# Patient Record
Sex: Male | Born: 2000 | Hispanic: Yes | Marital: Single | Smoking: Never smoker
Health system: Southern US, Community
[De-identification: ages and names within clinical notes are randomized; demographics above are authoritative.]

## PROBLEM LIST (undated history)

## (undated) DIAGNOSIS — J45909 Unspecified asthma, uncomplicated: Secondary | ICD-10-CM

---
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
## 2020-02-20 ENCOUNTER — Telehealth: Payer: Self-pay

## 2020-02-20 ENCOUNTER — Other Ambulatory Visit: Payer: Self-pay

## 2020-02-20 ENCOUNTER — Telehealth: Payer: Self-pay | Admitting: Emergency Medicine

## 2020-02-20 ENCOUNTER — Ambulatory Visit
Admission: EM | Admit: 2020-02-20 | Discharge: 2020-02-20 | Disposition: A | Discharge status: Home / Self Care | Payer: Self-pay | Attending: Emergency Medicine | Admitting: Emergency Medicine

## 2020-02-20 DIAGNOSIS — J4520 Mild intermittent asthma, uncomplicated: Secondary | ICD-10-CM

## 2020-02-20 DIAGNOSIS — Z760 Encounter for issue of repeat prescription: Secondary | ICD-10-CM

## 2020-02-20 MED ORDER — FLUTICASONE PROPIONATE 50 MCG/ACT NA SUSP: 2.0000 | Freq: Every day | NASAL | 0 refills | Status: DC

## 2020-02-20 MED ORDER — CETIRIZINE HCL 1 MG/ML PO SOLN: 10.0000 mg | Freq: Every day | ORAL | 0 refills | Status: DC

## 2020-02-20 MED ORDER — FLUTICASONE PROPIONATE (INHAL) 50 MCG/BLIST IN AEPB: 1.0000 | INHALATION_SPRAY | Freq: Two times a day (BID) | RESPIRATORY_TRACT | 12 refills | Status: AC

## 2020-02-20 MED ORDER — CETIRIZINE HCL 1 MG/ML PO SOLN: 10.0000 mg | Freq: Every day | ORAL | 0 refills | Status: AC

## 2020-02-20 MED ORDER — ALBUTEROL SULFATE HFA 108 (90 BASE) MCG/ACT IN AERS: 1.0000 | INHALATION_SPRAY | Freq: Four times a day (QID) | RESPIRATORY_TRACT | 0 refills | Status: AC | PRN

## 2020-02-20 MED ORDER — ALBUTEROL SULFATE HFA 108 (90 BASE) MCG/ACT IN AERS: 1.0000 | INHALATION_SPRAY | Freq: Four times a day (QID) | RESPIRATORY_TRACT | 1 refills | Status: DC | PRN

## 2020-02-20 MED ORDER — FLUTICASONE PROPIONATE 50 MCG/ACT NA SUSP: 2.0000 | Freq: Every day | NASAL | 0 refills | Status: AC

## 2020-02-20 NOTE — ED Triage Notes (Signed)
Pt c/o cough and congestion x 1 week, hx of Asthma, is out of albuterol inhaler and cannot get PCP appt until July

## 2020-02-20 NOTE — Telephone Encounter (Signed)
Patient's mother requests medication to be sent to another pharmacy

## 2020-02-20 NOTE — ED Provider Notes (Signed)
[facility]   [id number] 02/20/20 Arrival Time: 5397   CC: ASTHMA  SUBJECTIVE: History from: patient and family.  Corey Harvey is a 19 y.o. male who presents with complaint of intermittent productive cough with clear sputum, SOB, and chest tightness. Triggers: eating. Onset gradual, approximately 8 days ago. Describes wheezing as none when present. Fever: no. Overall normal PO intake without n/v. Sick contacts: no.  Typically his asthma is well controlled. Denies fever, chills, nausea, vomiting, chest pain, abdominal pain, changes in bowel or bladder function.    Ran out of inhaler.    ROS: As per HPI.  All other pertinent ROS negative.    Past Medical History:  Diagnosis Date  . Asthma    History reviewed. No pertinent surgical history. No Known Allergies No current facility-administered medications on file prior to encounter.   No current outpatient medications on file prior to encounter.   Social History   Socioeconomic History  . Marital status: Single    Spouse name: Not on file  . Number of children: Not on file  . Years of education: Not on file  . Highest education level: Not on file  Occupational History  . Not on file  Tobacco Use  . Smoking status: Never Smoker  . Smokeless tobacco: Never Used  Substance and Sexual Activity  . Alcohol use: No  . Drug use: No  . Sexual activity: Never  Other Topics Concern  . Not on file  Social History Narrative  . Not on file   Social Determinants of Health   Financial Resource Strain:   . Difficulty of Paying Living Expenses:   Food Insecurity:   . Worried About Charity fundraiser in the Last Year:   . Arboriculturist in the Last Year:   Transportation Needs:   . Film/video editor (Medical):   Marland Kitchen Lack of Transportation (Non-Medical):   Physical Activity:   . Days of Exercise per Week:   . Minutes of Exercise per Session:   Stress:   . Feeling of Stress :   Social Connections:   .  Frequency of Communication with Friends and Family:   . Frequency of Social Gatherings with Friends and Family:   . Attends Religious Services:   . Active Member of Clubs or Organizations:   . Attends Archivist Meetings:   Marland Kitchen Marital Status:   Intimate Partner Violence:   . Fear of Current or Ex-Partner:   . Emotionally Abused:   Marland Kitchen Physically Abused:   . Sexually Abused:    Family History  Problem Relation Age of Onset  . Healthy Mother   . Healthy Father     OBJECTIVE:  Vitals:   02/20/20 1520 02/20/20 1521  BP:  115/71  Pulse: 73   Resp: 18   Temp: 98.3 F (36.8 C)   SpO2: 96%      General appearance: alert; well-appearing HEENT: nasal congestion; clear runny nose; throat irritation secondary to post-nasal drainage Neck: supple without LAD Lungs: unlabored respirations, CTAB, mild cough; no significant respiratory distress Skin: warm and dry Psychological: alert and cooperative; normal mood and affect   ASSESSMENT & PLAN:  1. Mild intermittent asthma without complication   2. Medication refill      Meds ordered this encounter  Medications  . albuterol (VENTOLIN HFA) 108 (90 Base) MCG/ACT inhaler    Sig: Inhale 1-2 puffs into the lungs every 6 (six) hours as needed for wheezing or shortness of  breath.    Dispense:  18 g    Refill:  1    Order Specific Question:   Supervising Provider    Answer:   Eustace Moore [[id number]]  . cetirizine HCl (ZYRTEC) 1 MG/ML solution    Sig: Take 10 mLs (10 mg total) by mouth daily.    Dispense:  473 mL    Refill:  0    Order Specific Question:   Supervising Provider    Answer:   Eustace Moore [[id number]]  . fluticasone (FLONASE) 50 MCG/ACT nasal spray    Sig: Place 2 sprays into both nostrils daily.    Dispense:  16 g    Refill:  0    Order Specific Question:   Supervising Provider    Answer:   Eustace Moore [[id number]]   Push fluids and get rest Proair inhaler prescribed.  Use as  directed Cetirizine and flonase prescribed.  Take as directed Follow up with PCP as needed Return here or go to ER if you have any new or worsening symptoms such as shortness of breath, difficulty breathing, accessory muscle use, rib retraction, or if symptoms do not improve with medication    Reviewed expectations re: course of current medical issues. Questions answered. Outlined signs and symptoms indicating need for more acute intervention. Patient verbalized understanding. After Visit Summary given.          Rennis Harding, PA-C 02/20/20 1536

## 2020-02-20 NOTE — Discharge Instructions (Signed)
Push fluids and get rest Proair inhaler prescribed.  Use as directed Cetirizine and flonase prescribed.  Take as directed Follow up with PCP as needed Return here or go to ER if you have any new or worsening symptoms such as shortness of breath, difficulty breathing, accessory muscle use, rib retraction, or if symptoms do not improve with medication
